# Patient Record
Sex: Male | Born: 2000 | Hispanic: No | Marital: Single | Smoking: Never smoker
Health system: Southern US, Community
[De-identification: ages and names within clinical notes are randomized; demographics above are authoritative.]

## PROBLEM LIST (undated history)

## (undated) DIAGNOSIS — J302 Other seasonal allergic rhinitis: Secondary | ICD-10-CM

---
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
## 2022-06-24 ENCOUNTER — Ambulatory Visit: Admission: EM | Admit: 2022-06-24 | Discharge: 2022-06-24 | Discharge status: Absent without leave | Payer: Self-pay
